# Patient Record
Sex: Male | Born: 1985
Health system: Southern US, Community
[De-identification: ages and names within clinical notes are randomized; demographics above are authoritative.]

---

## 2011-06-05 ENCOUNTER — Encounter: Payer: Self-pay | Admitting: Emergency Medicine

## 2011-06-05 ENCOUNTER — Emergency Department: Admission: EM | Admit: 2011-06-05 | Discharge: 2011-06-05 | Payer: PRIVATE HEALTH INSURANCE | Source: Home / Self Care

## 2011-06-05 NOTE — ED Notes (Signed)
Knot in left bicep x 5 days

## 2011-06-06 ENCOUNTER — Ambulatory Visit (HOSPITAL_BASED_OUTPATIENT_CLINIC_OR_DEPARTMENT_OTHER)
Admission: RE | Admit: 2011-06-06 | Discharge: 2011-06-06 | Disposition: A | Discharge status: Home / Self Care | Payer: No Typology Code available for payment source | Source: Ambulatory Visit | Attending: Family Medicine | Admitting: Family Medicine

## 2011-06-06 ENCOUNTER — Ambulatory Visit (INDEPENDENT_AMBULATORY_CARE_PROVIDER_SITE_OTHER): Payer: PRIVATE HEALTH INSURANCE | Admitting: Family Medicine

## 2011-06-06 ENCOUNTER — Encounter: Payer: Self-pay | Admitting: Family Medicine

## 2011-06-06 VITALS — BP 129/84 | HR 83 | Temp 98.1°F | Ht 67.0 in | Wt 175.0 lb

## 2011-06-06 DIAGNOSIS — Z01818 Encounter for other preprocedural examination: Secondary | ICD-10-CM | POA: Insufficient documentation

## 2011-06-06 DIAGNOSIS — M79609 Pain in unspecified limb: Secondary | ICD-10-CM

## 2011-06-06 DIAGNOSIS — R229 Localized swelling, mass and lump, unspecified: Secondary | ICD-10-CM

## 2011-06-06 DIAGNOSIS — R2230 Localized swelling, mass and lump, unspecified upper limb: Secondary | ICD-10-CM | POA: Insufficient documentation

## 2011-06-06 NOTE — Progress Notes (Addendum)
  Subjective:    Patient ID: Gabriel Santos, male    DOB: 06-22-1985, 26 y.o.   MRN: [id number]  PCP: None  HPI 26 yo M here for left arm mass/pain.  Patient denies known injury. States about 5-6 days ago he first noticed a mass on inside of his left upper arm. Has grown since then, feels bigger at end of day than mornings. No other masses throughout body. No bruising. Is right handed. Not taking any medications or icing. Some aching within left bicep with movements. No fevers, chills, night sweats, fatigue, nausea, other complaints other than aching in this area.  History reviewed. No pertinent past medical history.  No current outpatient prescriptions on file prior to visit.    History reviewed. No pertinent past surgical history.  No Known Allergies  History   Social History  . Marital Status: Single    Spouse Name: N/A    Number of Children: N/A  . Years of Education: N/A   Occupational History  . Not on file.   Social History Main Topics  . Smoking status: Never Smoker   . Smokeless tobacco: Not on file  . Alcohol Use: 7.2 oz/week    12 Cans of beer per week  . Drug Use: Not on file  . Sexually Active: Not on file   Other Topics Concern  . Not on file   Social History Narrative  . No narrative on file    Family History  Problem Relation Age of Onset  . Sudden death Neg Hx   . Hypertension Neg Hx   . Hyperlipidemia Neg Hx   . Heart attack Neg Hx   . Diabetes Neg Hx     BP 129/84  Pulse 83  Temp(Src) 98.1 F (36.7 C) (Oral)  Ht 5\' 7"  (1.702 m)  Wt 175 lb (79.379 kg)  BMI 27.41 kg/m2  Review of Systems See HPI above.    Objective:   Physical Exam Gen: NAD  L arm: Palpable mildly tender mass distal medial upper arm but more proximal than where would expect epitrochlear lymph node.  Mass is not mobile.  No palpable pulsations.  No bruising. No other TTP about shoulder, elbow, upper arm. FROM shoulder and elbow - only mild ache in  swollen area noted above, no pain at shoulder or elbow. NVI distally.  MSK u/s: 2.0 x 1.2cm solid mass with high degree of neovascularity.  No apparent cystic component.  Images saved for documentation.    Assessment & Plan:  1. 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  Less likely an epitrochlear lymph node as this feels too proximal.  X-rays done to confirm this does not have a calcific component.  Move ahead with MRI with and without contrast to further assess.  Will contact patient with results and how to proceed following this.  Addendum: MRI results reviewed - area appears to be a very inflamed lymph node consistent with cat scratch fever.  He did note he has 2 cats but does not recall a scratch or bite from them.  Will follow closely though to ensure resolution.  Start azithromycin 500mg  first day, 250mg  days 2-5. F/u in 3-4 weeks for reevaluation, repeat ultrasound.

## 2011-06-06 NOTE — Assessment & Plan Note (Signed)
concerning for soft tissue sarcoma based on presentation, location, and ultrasound findings.  Less likely an epitrochlear lymph node as this feels too proximal.  X-rays done to confirm this does not have a calcific component.  Move ahead with MRI with and without contrast to further assess.  Will contact patient with results and how to proceed following this.

## 2011-06-06 NOTE — Patient Instructions (Signed)
We need to further evaluate this 2.0 x 1.2cm mass of your upper arm.  This does not look bony on the ultrasound - it looks like a lymph node or soft tissue mass. X-rays will confirm this is not bony then we'll move forward with further imaging (likely MRI) to further characterize this. I will call you the next business day to let you know what the MRI showed and next steps.

## 2011-06-08 ENCOUNTER — Ambulatory Visit (HOSPITAL_BASED_OUTPATIENT_CLINIC_OR_DEPARTMENT_OTHER)
Admission: RE | Admit: 2011-06-08 | Discharge: 2011-06-08 | Disposition: A | Discharge status: Home / Self Care | Payer: PRIVATE HEALTH INSURANCE | Source: Ambulatory Visit | Attending: Family Medicine | Admitting: Family Medicine

## 2011-06-08 DIAGNOSIS — R9389 Abnormal findings on diagnostic imaging of other specified body structures: Secondary | ICD-10-CM | POA: Insufficient documentation

## 2011-06-08 DIAGNOSIS — R2230 Localized swelling, mass and lump, unspecified upper limb: Secondary | ICD-10-CM

## 2011-06-08 DIAGNOSIS — R229 Localized swelling, mass and lump, unspecified: Secondary | ICD-10-CM | POA: Insufficient documentation

## 2011-06-08 MED ORDER — GADOBENATE DIMEGLUMINE 529 MG/ML IV SOLN
16.0000 mL | Freq: Once | INTRAVENOUS | Status: AC | PRN
  Administered 2011-06-08: 16 mL via INTRAVENOUS

## 2011-06-10 MED ORDER — AZITHROMYCIN 250 MG PO TABS: ORAL_TABLET | ORAL | Status: AC

## 2011-06-10 NOTE — Progress Notes (Signed)
Addended by: Lenda Kelp on: 06/10/2011 01:49 PM   Modules accepted: Orders

## 2011-07-01 ENCOUNTER — Encounter: Payer: Self-pay | Admitting: Family Medicine

## 2011-07-01 ENCOUNTER — Ambulatory Visit (INDEPENDENT_AMBULATORY_CARE_PROVIDER_SITE_OTHER): Payer: No Typology Code available for payment source | Admitting: Family Medicine

## 2011-07-01 VITALS — BP 136/74 | HR 81

## 2011-07-01 DIAGNOSIS — R2230 Localized swelling, mass and lump, unspecified upper limb: Secondary | ICD-10-CM

## 2011-07-01 DIAGNOSIS — M79672 Pain in left foot: Secondary | ICD-10-CM

## 2011-07-01 DIAGNOSIS — R229 Localized swelling, mass and lump, unspecified: Secondary | ICD-10-CM

## 2011-07-01 DIAGNOSIS — M79609 Pain in unspecified limb: Secondary | ICD-10-CM

## 2011-07-01 NOTE — Assessment & Plan Note (Signed)
epitrochlear lymphadenopathy, 2/2 cat scratch fever.  Mass is half size it was at initial presentation.  Symptoms much improved as well.  Finished course of azithromycin.  Should continue to resolve with time.  If worsens instead of improves, notices masses in axilla or supraclavicular region advised to return for follow-up.

## 2011-07-01 NOTE — Progress Notes (Signed)
  Subjective:    Patient ID: Gabriel Santos, male    DOB: Oct 21, 1985, 26 y.o.   MRN: [id number]  PCP: None  HPI  26 yo M here for f/u left arm mass/pain.  2/28: Patient denies known injury. States about 5-6 days ago he first noticed a mass on inside of his left upper arm. Has grown since then, feels bigger at end of day than mornings. No other masses throughout body. No bruising. Is right handed. Not taking any medications or icing. Some aching within left bicep with movements. No fevers, chills, night sweats, fatigue, nausea, other complaints other than aching in this area.  3/25: Patient reports he is doing very well. Mass has gone down significantly. Finished azithromycin. No pain currently. No other complaints.  History reviewed. No pertinent past medical history.  No current outpatient prescriptions on file prior to visit.    History reviewed. No pertinent past surgical history.  No Known Allergies  History   Social History  . Marital Status: Single    Spouse Name: N/A    Number of Children: N/A  . Years of Education: N/A   Occupational History  . Not on file.   Social History Main Topics  . Smoking status: Former Smoker    Quit date: 04/08/2008  . Smokeless tobacco: Not on file  . Alcohol Use: 7.2 oz/week    12 Cans of beer per week  . Drug Use: Not on file  . Sexually Active: Not on file   Other Topics Concern  . Not on file   Social History Narrative  . No narrative on file    Family History  Problem Relation Age of Onset  . Sudden death Neg Hx   . Hypertension Neg Hx   . Hyperlipidemia Neg Hx   . Heart attack Neg Hx   . Diabetes Neg Hx     BP 136/74  Pulse 81  Review of Systems  See HPI above.    Objective:   Physical Exam  Gen: NAD  L arm: Palpable minimally tender mass distal medial upper arm.  Mass is not mobile.  No palpable pulsations.  No bruising. No other TTP about shoulder, elbow, upper arm. FROM shoulder and  elbow without pain. NVI distally.  MSK u/s: 1.08 x 0.6cm solid mass with high degree of neovascularity.  No apparent cystic component.  Images saved for documentation.    Assessment & Plan:  1. Left arm mass - epitrochlear lymphadenopathy, 2/2 cat scratch fever.  Mass is half size it was at initial presentation.  Symptoms much improved as well.  Finished course of azithromycin.  Should continue to resolve with time.  If worsens instead of improves, notices masses in axilla or supraclavicular region advised to return for follow-up.

## 2015-02-06 ENCOUNTER — Encounter: Payer: Self-pay | Admitting: Sports Medicine

## 2015-02-06 ENCOUNTER — Ambulatory Visit (INDEPENDENT_AMBULATORY_CARE_PROVIDER_SITE_OTHER): Payer: PRIVATE HEALTH INSURANCE | Admitting: Sports Medicine

## 2015-02-06 VITALS — BP 145/79 | HR 81 | Ht 68.0 in | Wt 169.0 lb

## 2015-02-06 DIAGNOSIS — T23361A Burn of third degree of back of right hand, initial encounter: Secondary | ICD-10-CM | POA: Diagnosis not present
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
## 2015-02-09 ENCOUNTER — Telehealth: Payer: Self-pay

## 2015-02-09 NOTE — Telephone Encounter (Signed)
Patient will call his insurance and find a specialist for plastic surgery and send it through Tammy email  To be printed and given to Dr. Karie Schwalbe. Gabriel Santos,CMA

## 2018-06-09 ENCOUNTER — Encounter: Payer: Self-pay | Admitting: Sports Medicine

## 2018-06-09 ENCOUNTER — Ambulatory Visit (INDEPENDENT_AMBULATORY_CARE_PROVIDER_SITE_OTHER): Payer: BLUE CROSS/BLUE SHIELD

## 2018-06-09 ENCOUNTER — Ambulatory Visit (INDEPENDENT_AMBULATORY_CARE_PROVIDER_SITE_OTHER): Payer: BLUE CROSS/BLUE SHIELD | Admitting: Sports Medicine

## 2018-06-09 DIAGNOSIS — M7551 Bursitis of right shoulder: Secondary | ICD-10-CM

## 2018-06-09 DIAGNOSIS — Z0000 Encounter for general adult medical examination without abnormal findings: Secondary | ICD-10-CM | POA: Diagnosis not present

## 2018-06-09 MED ORDER — MELOXICAM 15 MG PO TABS
ORAL_TABLET | ORAL | 3 refills | Status: DC
Start: 2018-06-09 — End: 2018-09-09

## 2018-06-09 NOTE — Progress Notes (Signed)
Subjective:    CC: Right shoulder pain  HPI:  For the past several months this pleasant 33 year old male has had pain that he localizes over his deltoid, moderate, persistent, localized without radiation, worse with extension and overhead activities, feels pretty good today, occasionally wakes him from sleep.  No trauma.  I am now Gabriel Santos's PCP, he is related to the [place], he will return in 6 weeks for fasting CPE.  I reviewed the past medical history, family history, social history, surgical history, and allergies today and no changes were needed.  Please see the problem list section below in epic for further details.  Past Medical History: No past medical history on file. Past Surgical History: No past surgical history on file. Social History: Social History   Socioeconomic History  . Marital status: Single    Spouse name: Not on file  . Number of children: Not on file  . Years of education: Not on file  . Highest education level: Not on file  Occupational History  . Not on file  Social Needs  . Financial resource strain: Not on file  . Food insecurity:    Worry: Not on file    Inability: Not on file  . Transportation needs:    Medical: Not on file    Non-medical: Not on file  Tobacco Use  . Smoking status: Former Smoker    Last attempt to quit: 04/08/2008    Years since quitting: 10.1  . Smokeless tobacco: Never Used  Substance and Sexual Activity  . Alcohol use: Yes    Alcohol/week: 12.0 standard drinks    Types: 12 Cans of beer per week  . Drug use: Not on file  . Sexual activity: Not on file  Lifestyle  . Physical activity:    Days per week: Not on file    Minutes per session: Not on file  . Stress: Not on file  Relationships  . Social connections:    Talks on phone: Not on file    Gets together: Not on file    Attends religious service: Not on file    Active member of club or organization: Not on file    Attends meetings of clubs or organizations:  Not on file    Relationship status: Not on file  Other Topics Concern  . Not on file  Social History Narrative  . Not on file   Family History: Family History  Problem Relation Age of Onset  . Sudden death Neg Hx   . Hypertension Neg Hx   . Hyperlipidemia Neg Hx   . Heart attack Neg Hx   . Diabetes Neg Hx    Allergies: No Known Allergies Medications: See med rec.  Review of Systems: No headache, visual changes, nausea, vomiting, diarrhea, constipation, dizziness, abdominal pain, skin rash, fevers, chills, night sweats, swollen lymph nodes, weight loss, chest pain, body aches, joint swelling, muscle aches, shortness of breath, mood changes, visual or auditory hallucinations.  Objective:    General: Well Developed, well nourished, and in no acute distress.  Neuro: Alert and oriented x3, extra-ocular muscles intact, sensation grossly intact.  HEENT: Normocephalic, atraumatic, pupils equal round reactive to light, neck supple, no masses, no lymphadenopathy, thyroid nonpalpable.  Skin: Warm and dry, no rashes noted.  Cardiac: Regular rate and rhythm, no murmurs rubs or gallops.  Respiratory: Clear to auscultation bilaterally. Not using accessory muscles, speaking in full sentences.  Abdominal: Soft, nontender, nondistended, positive bowel sounds, no masses, no organomegaly.  Right shoulder:  Inspection reveals no abnormalities, atrophy or asymmetry. Palpation is normal with no tenderness over AC joint or bicipital groove. ROM is full in all planes. Rotator cuff strength normal throughout. Very mildly positive Neer and Hawkin's tests, empty can. Speeds and Yergason's tests normal. No labral pathology noted with negative Obrien's, negative crank, negative clunk, and good stability. Normal scapular function observed. No painful arc and no drop arm sign. No apprehension sign  Impression and Recommendations:    The patient was counselled, risk factors were discussed, anticipatory  guidance given.  Acute shoulder bursitis, right Mild symptoms, we will will start conservatively. Meloxicam, x-rays, home rehab exercises. Return in 6 weeks, injection if no better.  Annual physical exam Gabriel Santos will return fasting at the 6-week follow-up and we can do a physical with labs. ___________________________________________ Gabriel Santos. Gabriel Santos, M.D., ABFM., CAQSM. Primary Care and Sports Medicine [facility]  Adjunct Professor of Family Medicine  [employer]

## 2018-06-09 NOTE — Assessment & Plan Note (Signed)
Mild symptoms, we will will start conservatively. Meloxicam, x-rays, home rehab exercises. Return in 6 weeks, injection if no better.

## 2018-06-09 NOTE — Assessment & Plan Note (Signed)
[name] will return fasting at the 6-week follow-up and we can do a physical with labs.

## 2018-07-21 ENCOUNTER — Encounter: Payer: BLUE CROSS/BLUE SHIELD | Admitting: Sports Medicine

## 2018-08-26 ENCOUNTER — Encounter: Payer: BLUE CROSS/BLUE SHIELD | Admitting: Sports Medicine

## 2018-09-09 ENCOUNTER — Encounter: Payer: Self-pay | Admitting: Sports Medicine

## 2018-09-09 ENCOUNTER — Ambulatory Visit (INDEPENDENT_AMBULATORY_CARE_PROVIDER_SITE_OTHER): Payer: BC Managed Care – PPO | Admitting: Sports Medicine

## 2018-09-09 DIAGNOSIS — R7309 Other abnormal glucose: Secondary | ICD-10-CM | POA: Diagnosis not present

## 2018-09-09 DIAGNOSIS — Z0000 Encounter for general adult medical examination without abnormal findings: Secondary | ICD-10-CM

## 2018-09-09 DIAGNOSIS — E7800 Pure hypercholesterolemia, unspecified: Secondary | ICD-10-CM | POA: Diagnosis not present

## 2018-09-09 NOTE — Assessment & Plan Note (Signed)
Annual physical as above, checking routine labs. 

## 2018-09-09 NOTE — Progress Notes (Signed)
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  He builds horse barns.  He is happy, he is doing well, he has no complaints.  I reviewed the past medical history, family history, social history, surgical history, and allergies today and no changes were needed.  Please see the problem list section below in epic for further details.  Past Medical History: No past medical history on file. Past Surgical History: No past surgical history on file. Social History: Social History   Socioeconomic History  . Marital status: Single    Spouse name: Not on file  . Number of children: Not on file  . Years of education: Not on file  . Highest education level: Not on file  Occupational History  . Not on file  Social Needs  . Financial resource strain: Not on file  . Food insecurity:    Worry: Not on file    Inability: Not on file  . Transportation needs:    Medical: Not on file    Non-medical: Not on file  Tobacco Use  . Smoking status: Former Smoker    Last attempt to quit: 04/08/2008    Years since quitting: 10.4  . Smokeless tobacco: Never Used  Substance and Sexual Activity  . Alcohol use: Yes    Alcohol/week: 12.0 standard drinks    Types: 12 Cans of beer per week  . Drug use: Not on file  . Sexual activity: Not on file  Lifestyle  . Physical activity:    Days per week: Not on file    Minutes per session: Not on file  . Stress: Not on file  Relationships  . Social connections:    Talks on phone: Not on file    Gets together: Not on file    Attends religious service: Not on file    Active member of club or organization: Not on file    Attends meetings of clubs or organizations: Not on file    Relationship status: Not on file  Other Topics Concern  . Not on file  Social History Narrative  . Not on file   Family History:  Family History  Problem Relation Age of Onset  . Sudden death Neg Hx   . Hypertension Neg Hx   . Hyperlipidemia Neg Hx   . Heart attack Neg Hx   . Diabetes Neg Hx    Allergies: No Known Allergies Medications: See med rec.  Review of Systems: No headache, visual changes, nausea, vomiting, diarrhea, constipation, dizziness, abdominal pain, skin rash, fevers, chills, night sweats, swollen lymph nodes, weight loss, chest pain, body aches, joint swelling, muscle aches, shortness of breath, mood changes, visual or auditory hallucinations.  Objective:    General: Well Developed, well nourished, and in no acute distress.  Neuro: Alert and oriented x3, extra-ocular muscles intact, sensation grossly intact. Cranial nerves II through XII are intact, motor, sensory, and coordinative functions are all intact. HEENT: Normocephalic, atraumatic, pupils equal round reactive to light, neck supple, no masses, no lymphadenopathy, thyroid nonpalpable. Oropharynx, nasopharynx, external ear canals are unremarkable. Skin: Warm and dry, no rashes noted.  Cardiac: Regular rate and rhythm, no murmurs rubs or gallops.  Respiratory: Clear to auscultation bilaterally. Not using accessory muscles, speaking in full sentences.  Abdominal: Soft, nontender, nondistended, positive bowel sounds, no masses, no organomegaly.  Musculoskeletal: Shoulder,  elbow, wrist, hip, knee, ankle stable, and with full range of motion.  Impression and Recommendations:    The patient was counselled, risk factors were discussed, anticipatory guidance given.  Annual physical exam Annual physical as above, checking routine labs.   ___________________________________________ [name]. Benjamin Stain, M.D., ABFM., CAQSM. Primary Care and Sports Medicine [place]  Adjunct Professor of Family Medicine  [employer]

## 2018-09-10 LAB — CBC
HCT: 43.4 % (ref 38.5–50.0)
Hemoglobin: 14.7 g/dL (ref 13.2–17.1)
MCH: 29.4 pg (ref 27.0–33.0)
MCHC: 33.9 g/dL (ref 32.0–36.0)
MCV: 86.8 fL (ref 80.0–100.0)
MPV: 10.4 fL (ref 7.5–12.5)
Platelets: 213 10*3/uL (ref 140–400)
RBC: 5 10*6/uL (ref 4.20–5.80)
RDW: 11.8 % (ref 11.0–15.0)
WBC: 5.5 10*3/uL (ref 3.8–10.8)

## 2018-09-10 LAB — LIPID PANEL W/REFLEX DIRECT LDL
Cholesterol: 221 mg/dL — ABNORMAL HIGH (ref <200)
HDL: 64 mg/dL (ref >=40)
LDL Cholesterol (Calc): 143 mg/dL (calc) — ABNORMAL HIGH
Non-HDL Cholesterol (Calc): 157 mg/dL (calc) — ABNORMAL HIGH (ref <130)
Total CHOL/HDL Ratio: 3.5 (calc) (ref <5.0)
Triglycerides: 57 mg/dL (ref <150)

## 2018-09-10 LAB — COMPLETE METABOLIC PANEL WITH GFR
AG Ratio: 2.4 (calc) (ref 1.0–2.5)
ALT: 16 U/L (ref 9–46)
AST: 18 U/L (ref 10–40)
Albumin: 5 g/dL (ref 3.6–5.1)
Alkaline phosphatase (APISO): 39 U/L (ref 36–130)
BUN: 16 mg/dL (ref 7–25)
CO2: 26 mmol/L (ref 20–32)
Calcium: 9.8 mg/dL (ref 8.6–10.3)
Chloride: 103 mmol/L (ref 98–110)
Creat: 0.92 mg/dL (ref 0.60–1.35)
GFR, Est African American: 127 mL/min/{1.73_m2} (ref >=60)
GFR, Est Non African American: 110 mL/min/{1.73_m2} (ref >=60)
Globulin: 2.1 g/dL (calc) (ref 1.9–3.7)
Glucose, Bld: 104 mg/dL — ABNORMAL HIGH (ref 65–99)
Potassium: 4.5 mmol/L (ref 3.5–5.3)
Sodium: 137 mmol/L (ref 135–146)
Total Bilirubin: 0.8 mg/dL (ref 0.2–1.2)
Total Protein: 7.1 g/dL (ref 6.1–8.1)

## 2018-09-10 LAB — VITAMIN D 25 HYDROXY (VIT D DEFICIENCY, FRACTURES): Vit D, 25-Hydroxy: 43 ng/mL (ref 30–100)

## 2018-09-10 LAB — TSH: TSH: 2.73 mIU/L (ref 0.40–4.50)

## 2018-09-10 LAB — HEMOGLOBIN A1C
Hgb A1c MFr Bld: 4.6 % of total Hgb (ref <5.7)
Mean Plasma Glucose: 85 (calc)
eAG (mmol/L): 4.7 (calc)

## 2018-09-23 DIAGNOSIS — R7309 Other abnormal glucose: Secondary | ICD-10-CM | POA: Insufficient documentation

## 2018-09-23 DIAGNOSIS — E7800 Pure hypercholesterolemia, unspecified: Secondary | ICD-10-CM | POA: Insufficient documentation

## 2019-09-09 ENCOUNTER — Encounter: Payer: BC Managed Care – PPO | Admitting: Sports Medicine

## 2019-09-28 ENCOUNTER — Ambulatory Visit (INDEPENDENT_AMBULATORY_CARE_PROVIDER_SITE_OTHER): Payer: BC Managed Care – PPO | Admitting: Sports Medicine

## 2019-09-28 ENCOUNTER — Encounter: Payer: Self-pay | Admitting: Sports Medicine

## 2019-09-28 VITALS — BP 138/88 | HR 85 | Temp 98.0°F | Wt 175.9 lb

## 2019-09-28 DIAGNOSIS — R7309 Other abnormal glucose: Secondary | ICD-10-CM | POA: Diagnosis not present

## 2019-09-28 DIAGNOSIS — E7800 Pure hypercholesterolemia, unspecified: Secondary | ICD-10-CM

## 2019-09-28 DIAGNOSIS — Z0000 Encounter for general adult medical examination without abnormal findings: Secondary | ICD-10-CM

## 2019-09-28 NOTE — Patient Instructions (Signed)
Fat and Cholesterol Restricted Eating Plan Eating a diet that limits fat and cholesterol may help lower your risk for heart disease and other conditions. Your body needs fat and cholesterol for basic functions, but eating too much of these things can be harmful to your health. Your health care provider may order lab tests to check your blood fat (lipid) and cholesterol levels. This helps your health care provider understand your risk for certain conditions and whether you need to make diet changes. Work with your health care provider or dietitian to make an eating plan that is right for you.  What are tips for following this plan? General guidelines   If you are overweight, work with your health care provider to lose weight safely. Losing just 5-10% of your body weight can improve your overall health and help prevent diseases such as diabetes and heart disease.  Avoid: ? Foods with added sugar. ? Fried foods. ? Foods that contain partially hydrogenated oils, including stick margarine, some tub margarines, cookies, crackers, and other baked goods.  Limit alcohol intake to no more than 1 drink a day for nonpregnant women and 2 drinks a day for men. One drink equals 12 oz of beer, 5 oz of wine, or 1 oz of hard liquor. Reading food labels  Check food labels for: ? Trans fats, partially hydrogenated oils, or high amounts of saturated fat. Avoid foods that contain saturated fat and trans fat. ? The amount of cholesterol in each serving. Try to eat no more than 200 mg of cholesterol each day. ? The amount of fiber in each serving. Try to eat at least 20-30 g of fiber each day.  Choose foods with healthy fats, such as: ? Monounsaturated and polyunsaturated fats. These include olive and canola oil, flaxseeds, walnuts, almonds, and seeds. ? Omega-3 fats. These are found in foods such as salmon, mackerel, sardines, tuna, flaxseed oil, and ground flaxseeds.  Choose grain products that have whole  grains. Look for the word "whole" as the first word in the ingredient list. Cooking  Cook foods using methods other than frying. Baking, boiling, grilling, and broiling are some healthy options.  Eat more home-cooked food and less restaurant, buffet, and fast food.  Avoid cooking using saturated fats. ? Animal sources of saturated fats include meats, butter, and cream. ? Plant sources of saturated fats include palm oil, palm kernel oil, and coconut oil. Meal planning   At meals, imagine dividing your plate into fourths: ? Fill one-half of your plate with vegetables and green salads. ? Fill one-fourth of your plate with whole grains. ? Fill one-fourth of your plate with lean protein foods.  Eat fish that is high in omega-3 fats at least two times a week.  Eat more foods that contain fiber, such as whole grains, beans, apples, broccoli, carrots, peas, and barley. These foods help promote healthy cholesterol levels in the blood. Recommended foods Grains  Whole grains, such as whole wheat or whole grain breads, crackers, cereals, and pasta. Unsweetened oatmeal, bulgur, barley, quinoa, or brown rice. Corn or whole wheat flour tortillas. Vegetables  Fresh or frozen vegetables (raw, steamed, roasted, or grilled). Green salads. Fruits  All fresh, canned (in natural juice), or frozen fruits. Meats and other protein foods  Ground beef (85% or leaner), grass-fed beef, or beef trimmed of fat. Skinless chicken or turkey. Ground chicken or turkey. Pork trimmed of fat. All fish and seafood. Egg whites. Dried beans, peas, or lentils. Unsalted nuts or seeds. Unsalted   canned beans. Natural nut butters without added sugar and oil. Dairy  Low-fat or nonfat dairy products, such as skim or 1% milk, 2% or reduced-fat cheeses, low-fat and fat-free ricotta or cottage cheese, or plain low-fat and nonfat yogurt. Fats and oils  Tub margarine without trans fats. Light or reduced-fat mayonnaise and salad  dressings. Avocado. Olive, canola, sesame, or safflower oils. The items listed above may not be a complete list of recommended foods or beverages. Contact your dietitian for more options. Foods to avoid Grains  White bread. White pasta. White rice. Cornbread. Bagels, pastries, and croissants. Crackers and snack foods that contain trans fat and hydrogenated oils. Vegetables  Vegetables cooked in cheese, cream, or butter sauce. Fried vegetables. Fruits  Canned fruit in heavy syrup. Fruit in cream or butter sauce. Fried fruit. Meats and other protein foods  Fatty cuts of meat. Ribs, chicken wings, bacon, sausage, bologna, salami, chitterlings, fatback, hot dogs, bratwurst, and packaged lunch meats. Liver and organ meats. Whole eggs and egg yolks. Chicken and turkey with skin. Fried meat. Dairy  Whole or 2% milk, cream, half-and-half, and cream cheese. Whole milk cheeses. Whole-fat or sweetened yogurt. Full-fat cheeses. Nondairy creamers and whipped toppings. Processed cheese, cheese spreads, and cheese curds. Beverages  Alcohol. Sugar-sweetened drinks such as sodas, lemonade, and fruit drinks. Fats and oils  Butter, stick margarine, lard, shortening, ghee, or bacon fat. Coconut, palm kernel, and palm oils. Sweets and desserts  Corn syrup, sugars, honey, and molasses. Candy. Jam and jelly. Syrup. Sweetened cereals. Cookies, pies, cakes, donuts, muffins, and ice cream. The items listed above may not be a complete list of foods and beverages to avoid. Contact your dietitian for more information. Summary  Your body needs fat and cholesterol for basic functions. However, eating too much of these things can be harmful to your health.  Work with your health care provider and dietitian to follow a diet low in fat and cholesterol. Doing this may help lower your risk for heart disease and other conditions.  Choose healthy fats, such as monounsaturated and polyunsaturated fats, and foods high in  omega-3 fatty acids.  Eat fiber-rich foods, such as whole grains, beans, peas, fruits, and vegetables.  Limit or avoid alcohol, fried foods, and foods high in saturated fats, partially hydrogenated oils, and sugar. This information is not intended to replace advice given to you by your health care provider. Make sure you discuss any questions you have with your health care provider. Document Revised: 03/07/2017 Document Reviewed: 12/10/2016 Elsevier Patient Education  2020 Elsevier Inc.  

## 2019-09-28 NOTE — Progress Notes (Signed)
Subjective:    CC: Annual Physical Exam  HPI:  This patient is here for their annual physical  I reviewed the past medical history, family history, social history, surgical history, and allergies today and no changes were needed.  Please see the problem list section below in epic for further details.  Past Medical History: History reviewed. No pertinent past medical history. Past Surgical History: History reviewed. No pertinent surgical history. Social History: Social History   Socioeconomic History  . Marital status: Single    Spouse name: Not on file  . Number of children: Not on file  . Years of education: Not on file  . Highest education level: Not on file  Occupational History  . Not on file  Tobacco Use  . Smoking status: Former Smoker    Quit date: 04/08/2008    Years since quitting: 11.4  . Smokeless tobacco: Never Used  Vaping Use  . Vaping Use: Never used  Substance and Sexual Activity  . Alcohol use: Yes    Alcohol/week: 30.0 standard drinks    Types: 30 Cans of beer per week    Comment: Per week  . Drug use: Never  . Sexual activity: Yes  Other Topics Concern  . Not on file  Social History Narrative  . Not on file   Social Determinants of Health   Financial Resource Strain:   . Difficulty of Paying Living Expenses:   Food Insecurity:   . Worried About Charity fundraiser in the Last Year:   . Arboriculturist in the Last Year:   Transportation Needs:   . Film/video editor (Medical):   Marland Kitchen Lack of Transportation (Non-Medical):   Physical Activity:   . Days of Exercise per Week:   . Minutes of Exercise per Session:   Stress:   . Feeling of Stress :   Social Connections:   . Frequency of Communication with Friends and Family:   . Frequency of Social Gatherings with Friends and Family:   . Attends Religious Services:   . Active Member of Clubs or Organizations:   . Attends Archivist Meetings:   Marland Kitchen Marital Status:    Family  History: Family History  Problem Relation Age of Onset  . Sudden death Neg Hx   . Hypertension Neg Hx   . Hyperlipidemia Neg Hx   . Heart attack Neg Hx   . Diabetes Neg Hx    Allergies: No Known Allergies Medications: See med rec.  Review of Systems: No headache, visual changes, nausea, vomiting, diarrhea, constipation, dizziness, abdominal pain, skin rash, fevers, chills, night sweats, swollen lymph nodes, weight loss, chest pain, body aches, joint swelling, muscle aches, shortness of breath, mood changes, visual or auditory hallucinations.  Objective:    General: Well Developed, well nourished, and in no acute distress.  Neuro: Alert and oriented x3, extra-ocular muscles intact, sensation grossly intact. Cranial nerves II through XII are intact, motor, sensory, and coordinative functions are all intact. HEENT: Normocephalic, atraumatic, pupils equal round reactive to light, neck supple, no masses, no lymphadenopathy, thyroid nonpalpable. Oropharynx, nasopharynx, external ear canals are unremarkable. Skin: Warm and dry, no rashes noted.  Cardiac: Regular rate and rhythm, no murmurs rubs or gallops.  Respiratory: Clear to auscultation bilaterally. Not using accessory muscles, speaking in full sentences.  Abdominal: Soft, nontender, nondistended, positive bowel sounds, no masses, no organomegaly.  Musculoskeletal: Shoulder, elbow, wrist, hip, knee, ankle stable, and with full range of motion.  Impression and Recommendations:  The patient was counselled, risk factors were discussed, anticipatory guidance given.  Annual physical exam Annual physical as above, tetanus is up-to-date. Return in a year.  Elevated LDL cholesterol level Lipids have been elevated in the recent past. Rechecking today, if persistently elevated will have him work a little harder on a low-cholesterol diet.   ___________________________________________ [name]. Benjamin Stain, M.D., ABFM., CAQSM. Primary  Care and Sports Medicine [facility]  Adjunct Professor of Family Medicine  [employer]

## 2019-09-28 NOTE — Assessment & Plan Note (Signed)
Annual physical as above, tetanus is up-to-date. Return in a year.

## 2019-09-28 NOTE — Assessment & Plan Note (Signed)
Lipids have been elevated in the recent past. Rechecking today, if persistently elevated will have him work a little harder on a low-cholesterol diet.

## 2019-09-28 NOTE — Progress Notes (Signed)
Patient permanently declines COVID vaccine.

## 2019-09-29 LAB — LIPID PANEL W/REFLEX DIRECT LDL
Cholesterol: 206 mg/dL — ABNORMAL HIGH (ref <200)
HDL: 69 mg/dL (ref >=40)
LDL Cholesterol (Calc): 127 mg/dL (calc) — ABNORMAL HIGH
Non-HDL Cholesterol (Calc): 137 mg/dL (calc) — ABNORMAL HIGH (ref <130)
Total CHOL/HDL Ratio: 3 (calc) (ref <5.0)
Triglycerides: 34 mg/dL (ref <150)

## 2019-09-29 LAB — COMPLETE METABOLIC PANEL WITH GFR
AG Ratio: 2.5 (calc) (ref 1.0–2.5)
ALT: 21 U/L (ref 9–46)
AST: 17 U/L (ref 10–40)
Albumin: 5 g/dL (ref 3.6–5.1)
Alkaline phosphatase (APISO): 40 U/L (ref 36–130)
BUN: 16 mg/dL (ref 7–25)
CO2: 27 mmol/L (ref 20–32)
Calcium: 9.8 mg/dL (ref 8.6–10.3)
Chloride: 104 mmol/L (ref 98–110)
Creat: 0.89 mg/dL (ref 0.60–1.35)
GFR, Est African American: 130 mL/min/{1.73_m2} (ref >=60)
GFR, Est Non African American: 112 mL/min/{1.73_m2} (ref >=60)
Globulin: 2 g/dL (calc) (ref 1.9–3.7)
Glucose, Bld: 97 mg/dL (ref 65–99)
Potassium: 4.6 mmol/L (ref 3.5–5.3)
Sodium: 139 mmol/L (ref 135–146)
Total Bilirubin: 0.9 mg/dL (ref 0.2–1.2)
Total Protein: 7 g/dL (ref 6.1–8.1)

## 2019-09-29 LAB — HEMOGLOBIN A1C
Hgb A1c MFr Bld: 4.6 % of total Hgb (ref <5.7)
Mean Plasma Glucose: 85 (calc)
eAG (mmol/L): 4.7 (calc)

## 2019-09-29 LAB — CBC
HCT: 41.8 % (ref 38.5–50.0)
Hemoglobin: 14.4 g/dL (ref 13.2–17.1)
MCH: 30.2 pg (ref 27.0–33.0)
MCHC: 34.4 g/dL (ref 32.0–36.0)
MCV: 87.6 fL (ref 80.0–100.0)
MPV: 10.1 fL (ref 7.5–12.5)
Platelets: 192 10*3/uL (ref 140–400)
RBC: 4.77 10*6/uL (ref 4.20–5.80)
RDW: 12.6 % (ref 11.0–15.0)
WBC: 5.4 10*3/uL (ref 3.8–10.8)

## 2019-09-29 LAB — TSH: TSH: 2.46 mIU/L (ref 0.40–4.50)

## 2019-10-17 IMAGING — DX DG SHOULDER 2+V*R*
3 series · 3 of 3 positions shown · non-contrast
Comparison: None.

CLINICAL DATA: Acute right shoulder bursitis.

EXAM:
RIGHT SHOULDER - 2+ VIEW

[shoulder grashey]
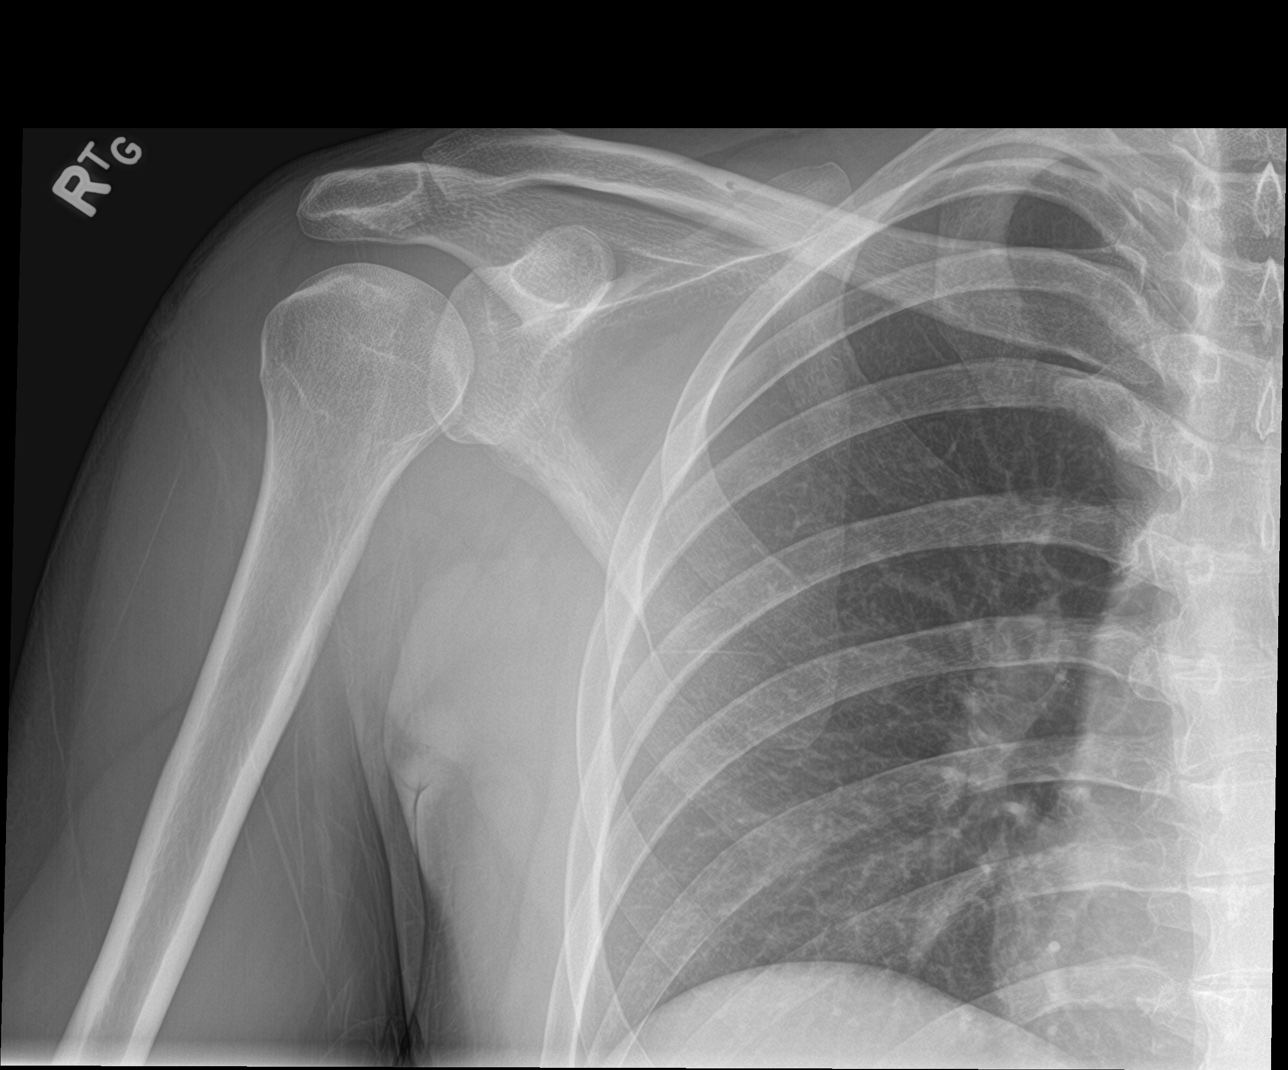

[shoulder y view]
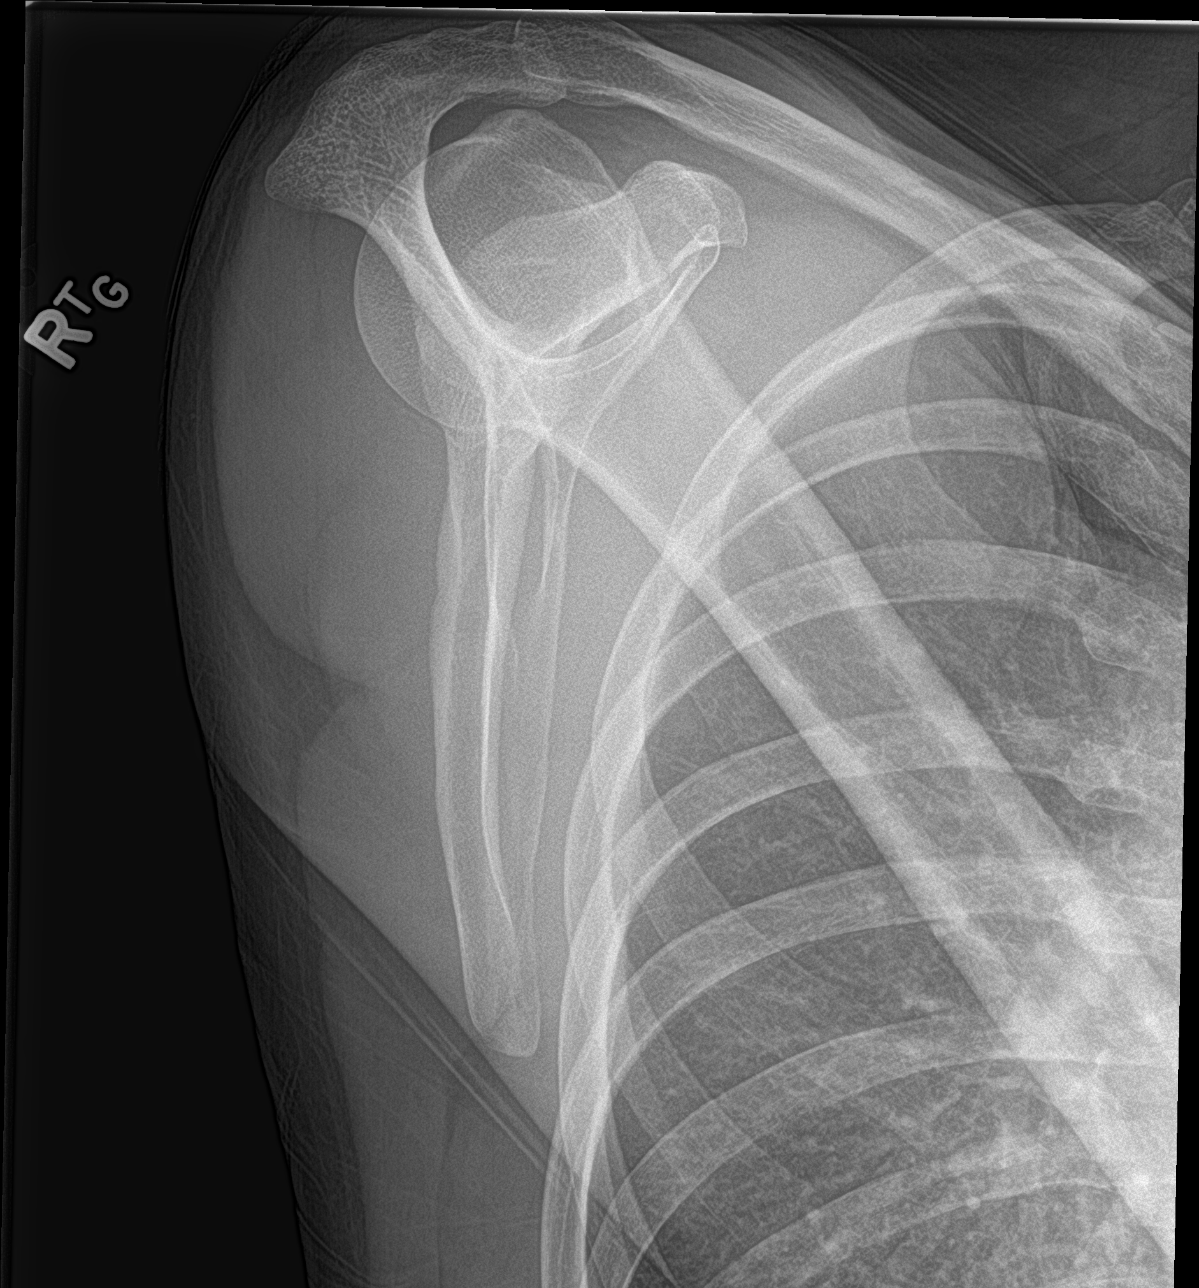

[shoulder axillary]
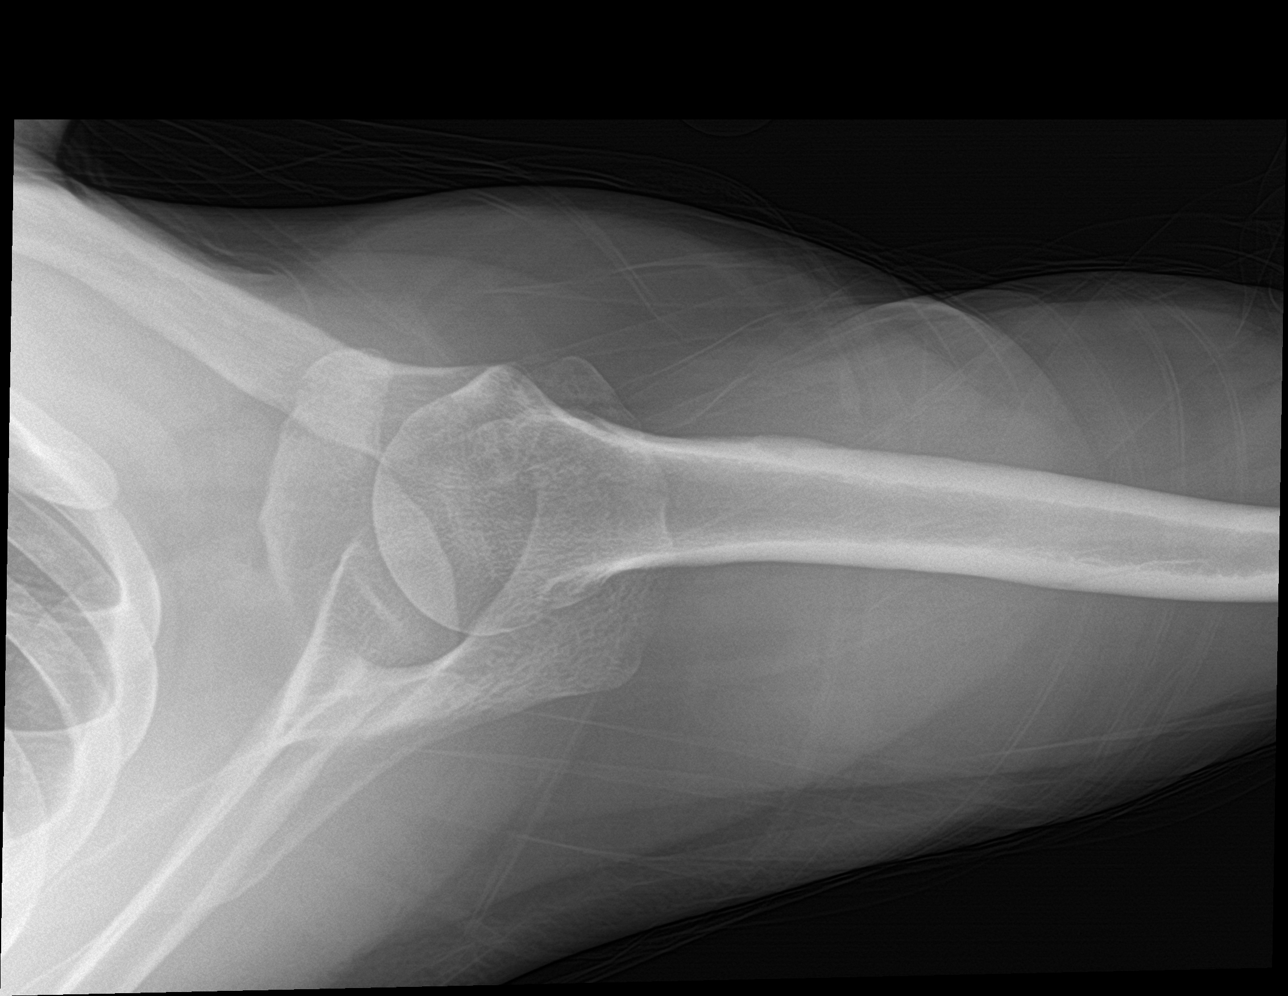

[3 of 3 positions shown; findings below may reference images not displayed]

FINDINGS: There is no evidence of fracture or dislocation. There is no
evidence of arthropathy or other focal bone abnormality. Soft
tissues are unremarkable.
IMPRESSION: Normal exam.

## 2020-09-28 ENCOUNTER — Encounter: Payer: BC Managed Care – PPO | Admitting: Sports Medicine

## 2020-10-16 DIAGNOSIS — F4320 Adjustment disorder, unspecified: Secondary | ICD-10-CM | POA: Diagnosis not present

## 2020-10-24 ENCOUNTER — Ambulatory Visit (INDEPENDENT_AMBULATORY_CARE_PROVIDER_SITE_OTHER): Payer: BC Managed Care – PPO | Admitting: Sports Medicine

## 2020-10-24 ENCOUNTER — Encounter: Payer: Self-pay | Admitting: Sports Medicine

## 2020-10-24 ENCOUNTER — Other Ambulatory Visit: Payer: Self-pay

## 2020-10-24 VITALS — BP 134/81 | HR 83 | Ht 68.0 in | Wt 179.7 lb

## 2020-10-24 DIAGNOSIS — Z0000 Encounter for general adult medical examination without abnormal findings: Secondary | ICD-10-CM | POA: Diagnosis not present

## 2020-10-24 DIAGNOSIS — E7800 Pure hypercholesterolemia, unspecified: Secondary | ICD-10-CM | POA: Diagnosis not present

## 2020-10-24 DIAGNOSIS — L988 Other specified disorders of the skin and subcutaneous tissue: Secondary | ICD-10-CM

## 2020-10-24 LAB — COMPREHENSIVE METABOLIC PANEL
AG Ratio: 2.5 (calc) (ref 1.0–2.5)
ALT: 23 U/L (ref 9–46)
AST: 16 U/L (ref 10–40)
Albumin: 4.9 g/dL (ref 3.6–5.1)
Alkaline phosphatase (APISO): 32 U/L — ABNORMAL LOW (ref 36–130)
BUN: 18 mg/dL (ref 7–25)
CO2: 27 mmol/L (ref 20–32)
Calcium: 9.6 mg/dL (ref 8.6–10.3)
Chloride: 105 mmol/L (ref 98–110)
Creat: 0.88 mg/dL (ref 0.60–1.26)
Globulin: 2 g/dL (calc) (ref 1.9–3.7)
Glucose, Bld: 100 mg/dL — ABNORMAL HIGH (ref 65–99)
Potassium: 4.6 mmol/L (ref 3.5–5.3)
Sodium: 138 mmol/L (ref 135–146)
Total Bilirubin: 0.7 mg/dL (ref 0.2–1.2)
Total Protein: 6.9 g/dL (ref 6.1–8.1)

## 2020-10-24 LAB — CBC
HCT: 44.2 % (ref 38.5–50.0)
Hemoglobin: 15.4 g/dL (ref 13.2–17.1)
MCH: 31 pg (ref 27.0–33.0)
MCHC: 34.8 g/dL (ref 32.0–36.0)
MCV: 88.9 fL (ref 80.0–100.0)
MPV: 10.4 fL (ref 7.5–12.5)
Platelets: 183 10*3/uL (ref 140–400)
RBC: 4.97 10*6/uL (ref 4.20–5.80)
RDW: 12.1 % (ref 11.0–15.0)
WBC: 4.2 10*3/uL (ref 3.8–10.8)

## 2020-10-24 LAB — LIPID PANEL
Cholesterol: 175 mg/dL (ref <200)
HDL: 56 mg/dL (ref >=40)
LDL Cholesterol (Calc): 106 mg/dL (calc) — ABNORMAL HIGH
Non-HDL Cholesterol (Calc): 119 mg/dL (calc) (ref <130)
Total CHOL/HDL Ratio: 3.1 (calc) (ref <5.0)
Triglycerides: 43 mg/dL (ref <150)

## 2020-10-24 LAB — TSH: TSH: 2.16 mIU/L (ref 0.40–4.50)

## 2020-10-24 NOTE — Progress Notes (Signed)
Subjective:    CC: Annual Physical Exam  HPI:  This patient is here for their annual physical  I reviewed the past medical history, family history, social history, surgical history, and allergies today and no changes were needed.  Please see the problem list section below in epic for further details.  Past Medical History: No past medical history on file. Past Surgical History: No past surgical history on file. Social History: Social History   Socioeconomic History   Marital status: Married    Spouse name: Not on file   Number of children: Not on file   Years of education: Not on file   Highest education level: Not on file  Occupational History   Not on file  Tobacco Use   Smoking status: Former    Types: Cigarettes    Quit date: 04/08/2008    Years since quitting: 12.5   Smokeless tobacco: Never  Vaping Use   Vaping Use: Never used  Substance and Sexual Activity   Alcohol use: Yes    Alcohol/week: 30.0 standard drinks    Types: 30 Cans of beer per week    Comment: Per week   Drug use: Never   Sexual activity: Yes  Other Topics Concern   Not on file  Social History Narrative   Not on file   Social Determinants of Health   Financial Resource Strain: Not on file  Food Insecurity: Not on file  Transportation Needs: Not on file  Physical Activity: Not on file  Stress: Not on file  Social Connections: Not on file   Family History: Family History  Problem Relation Age of Onset   Sudden death Neg Hx    Hypertension Neg Hx    Hyperlipidemia Neg Hx    Heart attack Neg Hx    Diabetes Neg Hx    Allergies: No Known Allergies Medications: See med rec.  Review of Systems: No headache, visual changes, nausea, vomiting, diarrhea, constipation, dizziness, abdominal pain, skin rash, fevers, chills, night sweats, swollen lymph nodes, weight loss, chest pain, body aches, joint swelling, muscle aches, shortness of breath, mood changes, visual or auditory  hallucinations.  Objective:    General: Well Developed, well nourished, and in no acute distress.  Neuro: Alert and oriented x3, extra-ocular muscles intact, sensation grossly intact. Cranial nerves II through XII are intact, motor, sensory, and coordinative functions are all intact. HEENT: Normocephalic, atraumatic, pupils equal round reactive to light, neck supple, no masses, no lymphadenopathy, thyroid nonpalpable. Oropharynx, nasopharynx, external ear canals are unremarkable. Skin: Warm and dry, no rashes noted.  Some hyperpigmented macules on the left arm. Cardiac: Regular rate and rhythm, no murmurs rubs or gallops.  Respiratory: Clear to auscultation bilaterally. Not using accessory muscles, speaking in full sentences.  Abdominal: Soft, nontender, nondistended, positive bowel sounds, no masses, no organomegaly.  Musculoskeletal: Shoulder, elbow, wrist, hip, knee, ankle stable, and with full range of motion.  Impression and Recommendations:    The patient was counselled, risk factors were discussed, anticipatory guidance given.  Annual physical exam Annual physical as above, having a bit of marital strife and is going through couples therapy. He can contact me if this affects his mood enough where it affects his work relationships or further affects his home relationships. Checking routine labs.  Skin macule There are a few hyperpigmented macules on the left arm, reminded about skin protection and sunscreen.   ___________________________________________ [name]. Benjamin Stain, M.D., ABFM., CAQSM. Primary Care and Sports Medicine [facility]  Adjunct Professor of [employer]

## 2020-10-24 NOTE — Assessment & Plan Note (Signed)
There are a few hyperpigmented macules on the left arm, reminded about skin protection and sunscreen.

## 2020-10-24 NOTE — Assessment & Plan Note (Signed)
Annual physical as above, having a bit of marital strife and is going through couples therapy. He can contact me if this affects his mood enough where it affects his work relationships or further affects his home relationships. Checking routine labs.

## 2020-10-26 DIAGNOSIS — F4320 Adjustment disorder, unspecified: Secondary | ICD-10-CM | POA: Diagnosis not present
